# Patient Record
Sex: Female | Born: 1965 | Race: Black or African American | Hispanic: No | Marital: Single | Smoking: Current some day smoker
Health system: Southern US, Community
[De-identification: ages and names within clinical notes are randomized; demographics above are authoritative.]

## PROBLEM LIST (undated history)

## (undated) ENCOUNTER — Ambulatory Visit (HOSPITAL_COMMUNITY): Discharge status: Absent without leave | Payer: BLUE CROSS/BLUE SHIELD

## (undated) HISTORY — PX: ECTOPIC PREGNANCY SURGERY: SHX613

---

## 2017-03-12 ENCOUNTER — Encounter (HOSPITAL_COMMUNITY): Payer: Self-pay | Admitting: Emergency Medicine

## 2017-03-12 ENCOUNTER — Other Ambulatory Visit: Payer: Self-pay

## 2017-03-12 ENCOUNTER — Ambulatory Visit (HOSPITAL_COMMUNITY)
Admission: EM | Admit: 2017-03-12 | Discharge: 2017-03-12 | Disposition: A | Discharge status: Home / Self Care | Payer: Self-pay | Attending: Family Medicine | Admitting: Family Medicine

## 2017-03-12 DIAGNOSIS — K047 Periapical abscess without sinus: Secondary | ICD-10-CM

## 2017-03-12 DIAGNOSIS — K0889 Other specified disorders of teeth and supporting structures: Secondary | ICD-10-CM

## 2017-03-12 MED ORDER — AMOXICILLIN-POT CLAVULANATE 875-125 MG PO TABS: 1.0000 | ORAL_TABLET | Freq: Two times a day (BID) | ORAL | 0 refills | Status: DC

## 2017-03-12 MED ORDER — IBUPROFEN 800 MG PO TABS: 800.0000 mg | ORAL_TABLET | Freq: Three times a day (TID) | ORAL | 0 refills | Status: DC

## 2017-03-12 MED ORDER — TRAMADOL HCL 50 MG PO TABS: 50.0000 mg | ORAL_TABLET | Freq: Four times a day (QID) | ORAL | 0 refills | Status: DC | PRN

## 2017-03-12 NOTE — ED Triage Notes (Signed)
Pt c/o bilateral tooth pain, lower L side and upper R side. Facial pain. No fevers.

## 2017-03-12 NOTE — Discharge Instructions (Signed)
Please take medications as prescribed.  Return to the urgent care facility for any fevers, increasing pain, worsening symptoms or urgent changes in your health.

## 2017-03-12 NOTE — ED Provider Notes (Signed)
[facility]    CSN: [id number] Arrival date & time: 03/12/17  1552     History   Chief Complaint Chief Complaint  Patient presents with  . Dental Pain    HPI Kristine Ball is a 52 y.o. female presents to the urgent care facility for evaluation of 4 days of dental pain.  She has had right upper dental pain along the right upper second molar in the left lower lateral incisor.  She has a history of numerous dental caries and dental infections.  She has not been to the dentist due to letting her insurance lapse.  Pain is 10 out of 10.  She has been taking over-the-counter medications with no improvement.  She has no trouble swallowing.  No fevers. HPI  History reviewed. No pertinent past medical history.  There are no active problems to display for this patient.   Past Surgical History:  Procedure Laterality Date  . ECTOPIC PREGNANCY SURGERY      OB History    No data available       Home Medications    Prior to Admission medications   Medication Sig Start Date End Date Taking? Authorizing Provider  amoxicillin-clavulanate (AUGMENTIN) 875-125 MG tablet Take 1 tablet by mouth every 12 (twelve) hours. 7 days 03/12/17   Evon SlackGaines, Thomas C, PA-C  ibuprofen (ADVIL,MOTRIN) 800 MG tablet Take 1 tablet (800 mg total) by mouth 3 (three) times daily. 03/12/17   Evon SlackGaines, Thomas C, PA-C  traMADol (ULTRAM) 50 MG tablet Take 1 tablet (50 mg total) by mouth every 6 (six) hours as needed. 03/12/17   Evon SlackGaines, Thomas C, PA-C    Family History No family history on file.  Social History Social History   Tobacco Use  . Smoking status: Current Some Day Smoker  Substance Use Topics  . Alcohol use: No    Frequency: Never  . Drug use: Not on file     Allergies   Patient has no known allergies.   Review of Systems Review of Systems  Constitutional: Negative for fever.  HENT: Positive for dental problem. Negative for trouble swallowing.   Respiratory: Negative for shortness  of breath.   Cardiovascular: Negative for chest pain.  Gastrointestinal: Negative for abdominal pain.  Genitourinary: Negative for difficulty urinating, dysuria and urgency.  Musculoskeletal: Negative for back pain and myalgias.  Skin: Negative for rash.  Neurological: Negative for dizziness and headaches.     Physical Exam Triage Vital Signs ED Triage Vitals  Enc Vitals Group     BP 03/12/17 1635 129/73     Pulse Rate 03/12/17 1635 74     Resp 03/12/17 1635 16     Temp 03/12/17 1635 98.3 F (36.8 C)     Temp src --      SpO2 03/12/17 1635 100 %     Weight --      Height --      Head Circumference --      Peak Flow --      Pain Score 03/12/17 1636 10     Pain Loc --      Pain Edu? --      Excl. in GC? --    No data found.  Updated Vital Signs BP 129/73   Pulse 74   Temp 98.3 F (36.8 C)   Resp 16   SpO2 100%   Visual Acuity Right Eye Distance:   Left Eye Distance:   Bilateral Distance:    Right Eye Near:  Left Eye Near:    Bilateral Near:     Physical Exam  Constitutional: She is oriented to person, place, and time. She appears well-developed and well-nourished. No distress.  HENT:  Head: Normocephalic and atraumatic.  Right Ear: External ear normal.  Left Ear: External ear normal.  Nose: Nose normal.  Mouth/Throat: Uvula is midline and oropharynx is clear and moist. No oral lesions. No trismus in the jaw. Normal dentition. Dental abscesses and dental caries present. No uvula swelling.    Eyes: Conjunctivae and EOM are normal.  Neck: Normal range of motion. Neck supple.  Cardiovascular: Normal rate. Exam reveals no gallop and no friction rub.  No murmur heard. Pulmonary/Chest: Effort normal and breath sounds normal. No respiratory distress.  Musculoskeletal: Normal range of motion.  Neurological: She is alert and oriented to person, place, and time.  Skin: Skin is warm and dry. No rash noted.  Psychiatric: She has a normal mood and affect. Her  behavior is normal. Thought content normal.     UC Treatments / Results  Labs (all labs ordered are listed, but only abnormal results are displayed) Labs Reviewed - No data to display  EKG  EKG Interpretation None       Radiology No results found.  Procedures Procedures (including critical care time)  Medications Ordered in UC Medications - No data to display   Initial Impression / Assessment and Plan / UC Course  I have reviewed the triage vital signs and the nursing notes.  Pertinent labs & imaging results that were available during my care of the patient were reviewed by me and considered in my medical decision making (see chart for details).     52 year old female with dental pain, dental infection.  She is given prescription for oral antibiotic, tramadol, ibuprofen.  She is given information on follow-up with dental clinic.  She is educated on signs and symptoms return to clinic for.  Final Clinical Impressions(s) / UC Diagnoses   Final diagnoses:  Pain, dental  Dental infection    ED Discharge Orders        Ordered    traMADol (ULTRAM) 50 MG tablet  Every 6 hours PRN     03/12/17 1757    ibuprofen (ADVIL,MOTRIN) 800 MG tablet  3 times daily     03/12/17 1757    amoxicillin-clavulanate (AUGMENTIN) 875-125 MG tablet  Every 12 hours     03/12/17 1757        Evon Slack, PA-C 03/12/17 1802

## 2017-08-09 ENCOUNTER — Encounter (HOSPITAL_COMMUNITY): Payer: Self-pay | Admitting: Emergency Medicine

## 2017-08-09 ENCOUNTER — Ambulatory Visit (INDEPENDENT_AMBULATORY_CARE_PROVIDER_SITE_OTHER): Payer: BLUE CROSS/BLUE SHIELD

## 2017-08-09 ENCOUNTER — Ambulatory Visit (HOSPITAL_COMMUNITY)
Admission: EM | Admit: 2017-08-09 | Discharge: 2017-08-09 | Disposition: A | Discharge status: Home / Self Care | Payer: BLUE CROSS/BLUE SHIELD | Attending: Family Medicine | Admitting: Family Medicine

## 2017-08-09 ENCOUNTER — Other Ambulatory Visit: Payer: Self-pay

## 2017-08-09 DIAGNOSIS — M778 Other enthesopathies, not elsewhere classified: Secondary | ICD-10-CM

## 2017-08-09 DIAGNOSIS — M7581 Other shoulder lesions, right shoulder: Secondary | ICD-10-CM

## 2017-08-09 DIAGNOSIS — M25511 Pain in right shoulder: Secondary | ICD-10-CM

## 2017-08-09 DIAGNOSIS — M65811 Other synovitis and tenosynovitis, right shoulder: Secondary | ICD-10-CM | POA: Diagnosis not present

## 2017-08-09 MED ORDER — KETOROLAC TROMETHAMINE 60 MG/2ML IM SOLN
INTRAMUSCULAR | Status: AC
  Filled 2017-08-09: qty 2

## 2017-08-09 MED ORDER — KETOROLAC TROMETHAMINE 60 MG/2ML IM SOLN
60.0000 mg | Freq: Once | INTRAMUSCULAR | Status: AC
  Administered 2017-08-09: 60 mg via INTRAMUSCULAR

## 2017-08-09 MED ORDER — NAPROXEN 500 MG PO TABS: 500.0000 mg | ORAL_TABLET | Freq: Two times a day (BID) | ORAL | 0 refills | Status: DC

## 2017-08-09 NOTE — ED Triage Notes (Signed)
Right shoulder pain started today.  Denies any injury.  Last night right shoulder was sore.  Pain has increased since onset.  Movement makes pain significantly worse.

## 2017-08-09 NOTE — Discharge Instructions (Signed)
Ice of shoulder at end of day. Light and regular activity still with shoulder. See exercises to promote ROM of shoulder and prevent frozen shoulder. Naproxen twice a day, take with food. First dose tonight. May follow up with Sports Medicine or Orthopedics as needed if symptoms worsen or persist.

## 2017-08-09 NOTE — ED Provider Notes (Signed)
[facility]    CSN: [id number] Arrival date & time: 08/09/17  1228     History   Chief Complaint Chief Complaint  Patient presents with  . Shoulder Pain    HPI Karthika Glasper is a 52 y.o. female.   Keelynn presents with right shoulder pain which is worse with any movement. Mild pain last night and this morning which has progressively worsened. No specific injury. She was off of work yesterday so no increase in activity. She is a Production assistant, radio, she is right handed. Denies any previous similar. Took ibuprofen last at 0900 which mildly helped. No numbness or tingling. Pain radiates down to elbow. Without contributing medical history.     ROS per HPI.      History reviewed. No pertinent past medical history.  There are no active problems to display for this patient.   Past Surgical History:  Procedure Laterality Date  . ECTOPIC PREGNANCY SURGERY      OB History   None      Home Medications    Prior to Admission medications   Medication Sig Start Date End Date Taking? Authorizing Provider  naproxen (NAPROSYN) 500 MG tablet Take 1 tablet (500 mg total) by mouth 2 (two) times daily. 08/09/17   Georgetta Haber, NP    Family History Family History  Problem Relation Age of Onset  . Hypertension Mother     Social History Social History   Tobacco Use  . Smoking status: Current Some Day Smoker  Substance Use Topics  . Alcohol use: No    Frequency: Never  . Drug use: Never     Allergies   Patient has no known allergies.   Review of Systems Review of Systems   Physical Exam Triage Vital Signs ED Triage Vitals  Enc Vitals Group     BP 08/09/17 1242 134/80     Pulse Rate 08/09/17 1242 70     Resp 08/09/17 1242 18     Temp 08/09/17 1242 98.5 F (36.9 C)     Temp Source 08/09/17 1242 Oral     SpO2 08/09/17 1242 100 %     Weight --      Height --      Head Circumference --      Peak Flow --      Pain Score 08/09/17 1239 10     Pain Loc --        Pain Edu? --      Excl. in GC? --    No data found.  Updated Vital Signs BP 134/80 (BP Location: Left Arm)   Pulse 70   Temp 98.5 F (36.9 C) (Oral)   Resp 18   SpO2 100%    Physical Exam  Constitutional: She is oriented to person, place, and time. She appears well-developed and well-nourished. No distress.  Cardiovascular: Normal rate, regular rhythm and normal heart sounds.  Pulmonary/Chest: Effort normal and breath sounds normal.  Musculoskeletal:       Right shoulder: She exhibits decreased range of motion, tenderness, pain and decreased strength. She exhibits no bony tenderness, no swelling, no effusion, no crepitus, no deformity, no laceration, no spasm and normal pulse.       Right elbow: Normal.      Right wrist: Normal.       Right hand: Normal.  Pain with raising of the right arm, unable to go above shoulder level due to pain at anterior and posterior shoulder musculature; tend on palpation; pain  with external rotation and weakness with external rotation compared to left arm; strength similar in other directions; no numbness or tingling, strong radial pulse; pain radiates down bicep without specific bicep tenderness; no elbow tenderness; no clavicle or ac joint tenderness  Neurological: She is alert and oriented to person, place, and time.  Skin: Skin is warm and dry.     UC Treatments / Results  Labs (all labs ordered are listed, but only abnormal results are displayed) Labs Reviewed - No data to display  EKG None  Radiology Dg Shoulder Right  Result Date: 08/09/2017 CLINICAL DATA:  Right shoulder pain with increased stiffness today. Decreasing range of motion. Posterior scapular pain. EXAM: RIGHT SHOULDER - 2+ VIEW COMPARISON:  None. FINDINGS: There is no evidence of fracture or dislocation. There is no evidence of arthropathy or other focal bone abnormality. Soft tissues are unremarkable. IMPRESSION: Negative right shoulder radiographs. Electronically Signed    By: Marin Robertshristopher  Mattern M.D.   On: 08/09/2017 13:44    Procedures Procedures (including critical care time)  Medications Ordered in UC Medications  ketorolac (TORADOL) injection 60 mg (60 mg Intramuscular Given 08/09/17 1302)    Initial Impression / Assessment and Plan / UC Course  I have reviewed the triage vital signs and the nursing notes.  Pertinent labs & imaging results that were available during my care of the patient were reviewed by me and considered in my medical decision making (see chart for details).     Sudden onset of pain without specific injury. Pain radiates down upper arm. Concern for rotator cuff tendonitis. Nsaids. ROM exercises provided. Follow up with sports medicine and/or orthopedics. Patient verbalized understanding and agreeable to plan.    Final Clinical Impressions(s) / UC Diagnoses   Final diagnoses:  Acute pain of right shoulder  Shoulder tendonitis, right     Discharge Instructions     Ice of shoulder at end of day. Light and regular activity still with shoulder. See exercises to promote ROM of shoulder and prevent frozen shoulder. Naproxen twice a day, take with food. First dose tonight. May follow up with Sports Medicine or Orthopedics as needed if symptoms worsen or persist.     ED Prescriptions    Medication Sig Dispense Auth. Provider   naproxen (NAPROSYN) 500 MG tablet Take 1 tablet (500 mg total) by mouth 2 (two) times daily. 30 tablet Georgetta HaberBurky, Kyrell Ruacho B, NP     Controlled Substance Prescriptions [place] Controlled Substance Registry consulted? Not Applicable   Georgetta HaberBurky, Sasha Rueth B, NP 08/09/17 1357

## 2017-08-15 ENCOUNTER — Ambulatory Visit (INDEPENDENT_AMBULATORY_CARE_PROVIDER_SITE_OTHER): Payer: BLUE CROSS/BLUE SHIELD | Admitting: Family Medicine

## 2017-08-15 DIAGNOSIS — M542 Cervicalgia: Secondary | ICD-10-CM

## 2017-08-15 MED ORDER — PREDNISONE 10 MG PO TABS: ORAL_TABLET | ORAL | 0 refills | Status: DC

## 2017-08-15 NOTE — Patient Instructions (Signed)
You have cervical radiculopathy (a pinched nerve). Prednisone 6 day dose pack to relieve irritation/inflammation of the nerve. Don't take the naproxen while on the prednisone. Ok to take tylenol if needed with this though. Consider cervical collar if severely painful. Simple range of motion exercises within limits of pain to prevent further stiffness. Consider physical therapy for stretching, exercises, traction, and modalities in the future. Heat 15 minutes at a time 3-4 times a day to help with spasms. Watch head position when on computers, texting, when sleeping in bed - should in line with back to prevent further nerve traction and irritation. If not improving we will consider an MRI. Follow up with me in 1 week.

## 2017-08-16 ENCOUNTER — Encounter: Payer: Self-pay | Admitting: Family Medicine

## 2017-08-16 DIAGNOSIS — M25511 Pain in right shoulder: Secondary | ICD-10-CM | POA: Insufficient documentation

## 2017-08-16 NOTE — Assessment & Plan Note (Signed)
concerning for cervical radiculopathy.  Start with prednisone dose pack, transition to naproxen.  Tylenol if needed.  Heat for spasms.  Discussed ergonomic issues.  F/u in 1 week.

## 2017-08-16 NOTE — Progress Notes (Signed)
PCP: Patient, No Pcp Per  Subjective:   HPI: Patient is a 52 y.o. female here for right neck/shoulder pain.  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 She reports on Thursday she noticed posterior right shoulder at work, arm pain that became more severe about 4 hours into shift. Worse with all movements of right arm. Radiates down to arm including hand cramping, some numbness. Pain level 7/10 and sharp but was 12/10 level when she went to the ED. Improving with naproxen but only mildly. No bowel/bladder dysfunction.  History reviewed. No pertinent past medical history.  Current Outpatient Medications on File Prior to Visit  Medication Sig Dispense Refill  . naproxen (NAPROSYN) 500 MG tablet Take 1 tablet (500 mg total) by mouth 2 (two) times daily. 30 tablet 0   No current facility-administered medications on file prior to visit.     Past Surgical History:  Procedure Laterality Date  . ECTOPIC PREGNANCY SURGERY      No Known Allergies  Social History   Socioeconomic History  . Marital status: Single    Spouse name: Not on file  . Number of children: Not on file  . Years of education: Not on file  . Highest education level: Not on file  Occupational History  . Not on file  Social Needs  . Financial resource strain: Not on file  . Food insecurity:    Worry: Not on file    Inability: Not on file  . Transportation needs:    Medical: Not on file    Non-medical: Not on file  Tobacco Use  . Smoking status: Current Some Day Smoker  Substance and Sexual Activity  . Alcohol use: No    Frequency: Never  . Drug use: Never  . Sexual activity: Not on file  Lifestyle  . Physical activity:    Days per week: Not on file    Minutes per session: Not on file  . Stress: Not on file  Relationships  . Social connections:    Talks on phone: Not on file    Gets together: Not on file    Attends religious service: Not on file    Active member of club or  organization: Not on file    Attends meetings of clubs or organizations: Not on file    Relationship status: Not on file  . Intimate partner violence:    Fear of current or ex partner: Not on file    Emotionally abused: Not on file    Physically abused: Not on file    Forced sexual activity: Not on file  Other Topics Concern  . Not on file  Social History Narrative  . Not on file    Family History  Problem Relation Age of Onset  . Hypertension Mother     BP 119/80   Ht 5\' 7"  (1.702 m)   Wt 176 lb (79.8 kg)   BMI 27.57 kg/m   Review of Systems: See HPI above.     Objective:  Physical Exam:  Gen: NAD, comfortable in exam room  Neck: No gross deformity, swelling, bruising. TTP right trapezius, cervical paraspinal region.  No midline/bony TTP. FROM. BUE strength 5/5. Sensation diminished right index and pinky digits.   1+ right triceps, 2+ left triceps and bilateral equal reflexes in biceps, brachioradialis tendons. Cap refill < 2 sec digits;  2+ radial pulses.  Right shoulder: No swelling, ecchymoses.  No gross deformity. No TTP except that noted above. FROM. Negative  Ananias Pilgrim. Negative Yergasons. Strength 5/5 with empty can and resisted internal/external rotation. Negative apprehension. NV intact distally.   Assessment & Plan:  1. Neck pain radiating into right arm - concerning for cervical radiculopathy.  Start with prednisone dose pack, transition to naproxen.  Tylenol if needed.  Heat for spasms.  Discussed ergonomic issues.  F/u in 1 week.

## 2017-08-22 ENCOUNTER — Ambulatory Visit
Admission: RE | Admit: 2017-08-22 | Discharge: 2017-08-22 | Disposition: A | Discharge status: Home / Self Care | Payer: BLUE CROSS/BLUE SHIELD | Source: Ambulatory Visit | Attending: Family Medicine | Admitting: Family Medicine

## 2017-08-22 ENCOUNTER — Inpatient Hospital Stay: Payer: BLUE CROSS/BLUE SHIELD

## 2017-08-22 ENCOUNTER — Ambulatory Visit (INDEPENDENT_AMBULATORY_CARE_PROVIDER_SITE_OTHER): Payer: BLUE CROSS/BLUE SHIELD | Admitting: Family Medicine

## 2017-08-22 ENCOUNTER — Encounter: Payer: Self-pay | Admitting: Family Medicine

## 2017-08-22 VITALS — BP 129/85 | Ht 67.0 in | Wt 176.0 lb

## 2017-08-22 DIAGNOSIS — M25511 Pain in right shoulder: Secondary | ICD-10-CM | POA: Diagnosis not present

## 2017-08-22 DIAGNOSIS — M542 Cervicalgia: Secondary | ICD-10-CM

## 2017-08-22 MED ORDER — MELOXICAM 15 MG PO TABS: 15.0000 mg | ORAL_TABLET | Freq: Every day | ORAL | 0 refills | Status: DC

## 2017-08-22 MED ORDER — METHYLPREDNISOLONE ACETATE 40 MG/ML IJ SUSP
40.0000 mg | Freq: Once | INTRAMUSCULAR | Status: AC
  Administered 2017-08-22: 40 mg via INTRA_ARTICULAR

## 2017-08-22 NOTE — Progress Notes (Signed)
Kristine BellowsStefanie Ball is a 52 y.o. female who is here for follow up of neck and shoulder pain.     HPI: Kristine HedgesStefanie is a 52 year old healthy female who returns to sports medicine for follow-up of right shoulder and neck pain.  Since her last visit, she reports no improvement of the pain.  Completed her prednisone as instructed without any relief.  She is currently in 9/10 pain diffusely spread in her right shoulder with severe limitations in all directions of movement. Additionally, she describes new pain spreading from the anterior shoulder partially across her upper chest. According to patient, pain increases with any movement.  If she tries to extend her right arm reports pain radiates up the right side of her neck.  Does not feel that the pain starts in her neck and goes down her arm.  She is right-handed and is having difficulty completing daily tasks such as brushing her hair or opening containers.  Believe she has both pain and weakness of the right shoulder and upper arm.  Denies any numbness or tingling in her arm or hand.  No overlying skin changes or masses.   Has been wearing the sling when she is out of the house, but even when she does not the sling on she holds her arm partially flexed at elbow for comfort.  Has not tried any OTC pain medications as she thought that she could not combine them with prednisone.  No other treatment tried.  No other joint pain.  No other changes in health since last visit.  Denies any chronic medical conditions or daily medications.  Patient was last seen in sports medicine on 08/15/2017 for neck pain and posterior right shoulder pain.  No, there was no preceding injury prior to the onset of this pain.  Denies any history of shoulder or neck injury.  Prior to onset of this pain, patient reports she was at work trying to scoop a large amount of sugar. No known injury at that time.  Review of systems:  As stated above   Interval past medical history, surgical history,  family history, and social history obtained and reviewed.   Patient Active Problem List   Diagnosis Date Noted  . Neck pain on right side 08/16/2017    Physical Exam:  BP 129/85   Ht 5\' 7"  (1.702 m)   Wt 176 lb (79.8 kg)   BMI 27.57 kg/m    Physical Exam  Physical exam: Vital signs are reviewed and are documented in the chart Gen.: Alert, oriented, appears stated age, cries with shoulder exam HEENT: Moist oral mucosa Respiratory: Normal respirations, able to speak in full sentences Cardiac: Regular rate, distal pulses 2+ Integumentary: No rashes on visible skin Neurologic: Strength 4/5 for rotator cuff and elbow flexion/extension but limited testing due to pain. Normal wrist, grip, and hand strength. Sensation intact in bilateral upper extremities. Psych: Normal affect, mood is described as good Musculoskeletal:  C-Spine: No palpable deformity or bony tenderness. Equivocal Spurlings. Shoulder/upper back: Even when sling is removed, she preferentially holds arm flexed at elbow and in front of body (like it is still in the sling). Inspection of right shoulder, neck and anterior upper chest reveals no obvious deformity or muscle atrophy. No warmth, erythema, ecchymosis, or effusion. She is diffusely tender to palpation across right upper chest, on anterior and posterior right shoulder (including over scapular spin, distal clavicle, AC joint), and extending onto levator scapulae and right paracervical muscles. Likely muscle spasms of upper  back and right paracervical muscles. She is tender over biceps tendon, but not distinct or greater tenderness vs. Other areas. Limited shoulder ROM due to pain in all directions: External rotation 70, Internal rotation normal, 70 deg abduction, 30 int ROM, 70 deg ext ROM. Positive drop arm test. With arm extended, pt reports pain radiates into the right side of her neck. Special tests: All positive for pain on right side. Haw[place], Neers, O'Brian, Cross  arm, Spur, [place], empty can. Left shoulder exam normal.   Shoulder injection:  After written informed consent signed and obtained, and benefit of pain relief and risk of bleeding, infection, and steroid flare discussed, Kristine Ball agreed to proceed with cortisone injection into the right subacromial bursa. After timeout, area  was cleaned with Betadine and alcohol wipes. Ethyl chloride was used as topical anesthetic spray. Using 4 cc 1% lidocaine without epinephrine and 2 cc of 40 mg Depo-Medrol on a 22-gauge 1.5 inch needle, her right subacromial bursa was injected. She did not have any bleeding afterwards. No complications noted from procedure.  Assessment/Plan: Kristine Ball is a 52yr old healthy female who returns to sports medicine for persistent right shoulder pain, with radiation into her neck. She has severely limited ROM, diffuse tenderness, weakness (though suspect secondary to pain), and all positive special tests. However, given her limited ROM and current severe level of pain, exam is difficult to localize etiology of pain. Etiologies considered include a rotator cuff abnormality, tendinopathy, or referred cervical radiculopathy. Her pain and exam is likely worse due to her limited movement and sling use, and she is at risk for developing adhesive capsulitis. She was given a subacromial steroid injection today for both therapeutic and diagnostic use; she did have small improvement in ROM and pain after immediately after injection which makes shoulder pathology more likely. Since cervical radiculopathy cannot be excluded, will get a c-spine xray today. She had a shoulder xray in urgent care on 08/09/17 which was unremarkable. If symptoms do not improve with below treatment, could consider an MRI.  1) Right shoulder pain and stiffness -Recommend gentle ROM exercises today/tomorrow as steroids take effect. Limit use of sling. -Referred for physical therapy -C-Spine xray to eval for DDD, bony  abormality or other irregularities -Rx Mobic 15mg  daily, try heat/massage/ice to tight upper back/neck muscles   Follow up in 2 weeks for reevaluation   Annell Greening, MD, MS [facility] Primary Care Pediatrics PGY3  I independently saw and evaluated patient alongside with Annell Greening MD, PGY3 and agree with her assessment and plan. In brief summary, Kristine Ball was seen here last week by Dr. Pearletha Forge for initial presentation and evaluation of right shoulder pain. It was felt that symptoms were referred from right cervical radiculopathy. She was prescribed prednisone dose-pak. Unfortunately, she has not had any interval improvement today. She is using a sling and is much more guarded today in her right shoulder and has decreased ROM in right shoulder secondary to pain. Given pain and limitations in ROM, offered her cortisone injection into right subacromial bursa to help with pain relief. She is agreeable to this, injection done as noted above without any complications noted. She will be getting XR of her C-spine for suspect right cervical radiculopathy. Will send in for meloxicam 15 mg daily, discussed not to take any other ibuprofen, motrin, or Aleve while on this. Tylenol is ok to use. She may need MRI evaluation if she continues to have pain. Given concern for stiffness and limitations in ROM, discussed referral  to formal PT. She is agreeable to this. Doubt she is developing adhesive capsulitis, suspect more guarding than anything else. She will follow up with Dr. Pearletha Forge in 2 weeks for re-evaluation or sooner as needed.   Haynes Kerns, MD Primary Care Sports Medicine Fellow [facility]

## 2017-08-22 NOTE — Progress Notes (Deleted)
Patient ID: Jonathon BellowsStefanie Wetherby, female   DOB: 08/11/1965, 52 y.o.   MRN: [id number] Seen in ED 08/09/2017: right shoulder pain which is worse with any movement. Mild pain last night and this morning which has progressively worsened. No specific injury. She was off of work yesterday so no increase in activity. She is a Production assistant, radioserver, she is right handed. Denies any previous similar. Took ibuprofen last at 0900 which mildly helped. No numbness or tingling. Pain radiates down to elbow. Without contributing medical history.    From A/P: Sudden onset of pain without specific injury. Pain radiates down upper arm. Concern for rotator cuff tendonitis. Nsaids. ROM exercises provided. Follow up with sports medicine and/or orthopedics. Patient verbalized understanding and agreeable to plan.   Saw sports medicine 08/15/2017: From A/P: 1. Neck pain radiating into right arm - concerning for cervical radiculopathy.  Start with prednisone dose pack, transition to naproxen.  Tylenol if needed.  Heat for spasms.  Discussed ergonomic issues.  F/u in 1 week.

## 2017-09-05 ENCOUNTER — Ambulatory Visit: Payer: Self-pay

## 2017-09-05 ENCOUNTER — Ambulatory Visit (INDEPENDENT_AMBULATORY_CARE_PROVIDER_SITE_OTHER): Payer: BLUE CROSS/BLUE SHIELD | Admitting: Family Medicine

## 2017-09-05 ENCOUNTER — Encounter: Payer: Self-pay | Admitting: Family Medicine

## 2017-09-05 VITALS — BP 122/82 | Ht 67.0 in | Wt 176.0 lb

## 2017-09-05 DIAGNOSIS — M25511 Pain in right shoulder: Secondary | ICD-10-CM | POA: Diagnosis not present

## 2017-09-05 MED ORDER — CYCLOBENZAPRINE HCL 10 MG PO TABS: 10.0000 mg | ORAL_TABLET | Freq: Three times a day (TID) | ORAL | 1 refills | Status: DC | PRN

## 2017-09-05 NOTE — Progress Notes (Signed)
PCP: Patient, No Pcp Per  Subjective:   HPI: Patient is a 52 y.o. female here for followup for right shoulder and neck pain. She has completed about 5 visits of physical therapy. At the prior visit she was given a subacromial corticosteroid injection and started on meloxicam. She feels that PT has helped slightly with some ROM but she continues to have significant limitations; she cannot fully extend her elbow, she is unable to flex or abduct at the shoulder without assistance from her left hand. She is also limited with external rotation. Regarding the steroid injection, she reports only about a day and 1/2 of relief from this. She feels the mobic has not been helpful. She continues to have pain throughout her entire shoulder and right side of her neck. She maintains a position of internal rotation and flexion at the elbow for comfort. She occasionally wears a sling but acknowledges this has negatively effected her ROM. She denies any numbness or tingling in the distal extremity.  No past medical history on file.  Current Outpatient Medications on File Prior to Visit  Medication Sig Dispense Refill  . meloxicam (MOBIC) 15 MG tablet Take 1 tablet (15 mg total) by mouth daily. 30 tablet 0  . naproxen (NAPROSYN) 500 MG tablet Take 1 tablet (500 mg total) by mouth 2 (two) times daily. (Patient not taking: Reported on 09/05/2017) 30 tablet 0  . predniSONE (DELTASONE) 10 MG tablet 6 tabs po day 1, 5 tabs po day 2, 4 tabs po day 3, 3 tabs po day 4, 2 tabs po day 5, 1 tab po day 6 (Patient not taking: Reported on 09/05/2017) 21 tablet 0   No current facility-administered medications on file prior to visit.     Past Surgical History:  Procedure Laterality Date  . ECTOPIC PREGNANCY SURGERY      No Known Allergies  Social History   Socioeconomic History  . Marital status: Single    Spouse name: Not on file  . Number of children: Not on file  . Years of education: Not on file  . Highest education  level: Not on file  Occupational History  . Not on file  Social Needs  . Financial resource strain: Not on file  . Food insecurity:    Worry: Not on file    Inability: Not on file  . Transportation needs:    Medical: Not on file    Non-medical: Not on file  Tobacco Use  . Smoking status: Current Some Day Smoker  Substance and Sexual Activity  . Alcohol use: No    Frequency: Never  . Drug use: Never  . Sexual activity: Not on file  Lifestyle  . Physical activity:    Days per week: Not on file    Minutes per session: Not on file  . Stress: Not on file  Relationships  . Social connections:    Talks on phone: Not on file    Gets together: Not on file    Attends religious service: Not on file    Active member of club or organization: Not on file    Attends meetings of clubs or organizations: Not on file    Relationship status: Not on file  . Intimate partner violence:    Fear of current or ex partner: Not on file    Emotionally abused: Not on file    Physically abused: Not on file    Forced sexual activity: Not on file  Other Topics Concern  .  Not on file  Social History Narrative  . Not on file    Family History  Problem Relation Age of Onset  . Hypertension Mother     BP 122/82   Ht 5\' 7"  (1.702 m)   Wt 176 lb (79.8 kg)   BMI 27.57 kg/m   Review of Systems: See HPI above.     Objective:  Physical Exam:  Gen: NAD, comfortable in exam room  Neck: No gross deformity, swelling, bruising. TTP right trapezius, paraspinal cervical region.  No midline/bony TTP. FROM. Sensation intact to light touch.   NV intact distal BUEs.  Shoulder: Right shoulder held slightly elevated and internally rotated and holding elbow in flextion. No bruising. No swelling TTP globally, no focal tenderness.  Pt unable to abduct or forward flex at the shoulder. ER limited to about 10 degrees. IR normal. NV intact distally Special Tests: pan positive  Right elbow: No  deformity. Lacks 10 degrees extension.  Full flexion.  5/5 strength flexion/extension. No tenderness to palpation. NVI distally.  [facility] Ultrasound - Shoulder, Right: Biceps Tendon: intact without evidence of tenosynovitis on long and trans views Subscapularis: intact without tears AC joint: mild arthritic changes seen. No geyser sign Infraspinatus/Teres: intact without tears Supraspinatus: suboptimal visualization due to pt not tolerating positioning. However, no tears were identified   Assessment & Plan:  1. Right shoulder pain - nonspecific right shoulder pain which is significantly limiting her function. C-spine xrays reviewed which show only mild degenerative changes and do not correlate with her symptoms. Doubt RTC pathology as I would have expected better improvement following her subacromial injection and her US performed today in clinic showed no obvious pathology. Despite her pain, her exam is not c/w adhesive capsulitis. Muscle spasm vs brachial neuritis is most likely current working diagnosis as shoulder joint pathology seems less likely - continue physical therapy to work on ROM - continue NSAIDs - Flexeril 10mg  q8hr prn - it is likely that a significant amount of her symptoms are due to muscle tightness/spasm. Will try course of flexeril  2. Biceps tendon contracture - 2/2 prolong immobilization. - pt was encouraged to work on home exercises to improve her ROM at the elbow. - discouraged further restriction with wearing sling

## 2017-09-05 NOTE — Assessment & Plan Note (Signed)
nonspecific right shoulder pain which is significantly limiting her function. C-spine xrays reviewed which show only mild degenerative changes and do not correlate with her symptoms. Doubt RTC pathology as I would have expected better improvement following her subacromial injection and her US performed today in clinic showed no obvious pathology. Despite her pain, her exam is not c/w adhesive capsulitis. Muscle spasm vs brachial neuritis is most likely current working diagnosis as shoulder joint pathology seems less likely - continue physical therapy to work on ROM - continue NSAIDs - Flexeril 10mg  q8hr prn - it is likely that a significant amount of her symptoms are due to muscle tightness/spasm. Will try course of flexeril

## 2017-09-05 NOTE — Patient Instructions (Signed)
Continue with your physical therapy. Continue the meloxicam - you could try aleve 2 tabs twice a day with food instead if this isn't helping you enough. Flexeril as needed for muscle spasms - this may make you sleepy. Topical capsaicin, topical salon pas patches may help. We discussed [place] is a condition that commonly presents this way with severe pain into the shoulder and developing weakness - therapy, the prednisone you took, and medications above are the treatment for this. We can consider imaging your neck (MRI), nerve-blocking medications as well if you're struggling. Follow up with me in 4 weeks.

## 2017-09-17 ENCOUNTER — Ambulatory Visit: Payer: BLUE CROSS/BLUE SHIELD | Admitting: Family Medicine

## 2017-09-19 ENCOUNTER — Ambulatory Visit (INDEPENDENT_AMBULATORY_CARE_PROVIDER_SITE_OTHER): Payer: BLUE CROSS/BLUE SHIELD | Admitting: Family Medicine

## 2017-09-19 ENCOUNTER — Encounter: Payer: Self-pay | Admitting: Family Medicine

## 2017-09-19 DIAGNOSIS — M25511 Pain in right shoulder: Secondary | ICD-10-CM

## 2017-09-19 NOTE — Patient Instructions (Signed)
We will go ahead with an MRI of your shoulder to assess for a rotator cuff tear since you've not improved with physical therapy, home exercises, meloxicam, naproxen, prednisone, flexeril, injection. I will call you with results and next steps.

## 2017-09-19 NOTE — Progress Notes (Addendum)
PCP: Patient, No Pcp Per  Subjective:   HPI: Patient is a 52 y.o. female here for right neck/shoulder pain.  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 She reports on Thursday she noticed posterior right shoulder at work, arm pain that became more severe about 4 hours into shift. Worse with all movements of right arm. Radiates down to arm including hand cramping, some numbness. Pain level 7/10 and sharp but was 12/10 level when she went to the ED. Improving with naproxen but only mildly. No bowel/bladder dysfunction.  7/17: Patient is a 52 y.o. female here for followup for right shoulder and neck pain. She has completed about 5 visits of physical therapy. At the prior visit she was given a subacromial corticosteroid injection and started on meloxicam. She feels that PT has helped slightly with some ROM but she continues to have significant limitations; she cannot fully extend her elbow, she is unable to flex or abduct at the shoulder without assistance from her left hand. She is also limited with external rotation. Regarding the steroid injection, she reports only about a day and 1/2 of relief from this. She feels the mobic has not been helpful. She continues to have pain throughout her entire shoulder and right side of her neck. She maintains a position of internal rotation and flexion at the elbow for comfort. She occasionally wears a sling but acknowledges this has negatively effected her ROM. She denies any numbness or tingling in the distal extremity.  7/31: Patient reports she continues to struggle with right shoulder pain. She states she still taking meloxicam and Flexeril. The pain is anterior and lateral, 8 out of 10 level and sharp. Is rating up into the right side of her neck. She is doing some of her home exercises she learned in physical therapy. No skin changes, numbness. No new injuries.  History reviewed. No pertinent past medical history.  Current  Outpatient Medications on File Prior to Visit  Medication Sig Dispense Refill  . cyclobenzaprine (FLEXERIL) 10 MG tablet Take 1 tablet (10 mg total) by mouth 3 (three) times daily as needed for muscle spasms. 60 tablet 1  . meloxicam (MOBIC) 15 MG tablet Take 1 tablet (15 mg total) by mouth daily. 30 tablet 0  . naproxen (NAPROSYN) 500 MG tablet Take 1 tablet (500 mg total) by mouth 2 (two) times daily. (Patient not taking: Reported on 09/05/2017) 30 tablet 0  . predniSONE (DELTASONE) 10 MG tablet 6 tabs po day 1, 5 tabs po day 2, 4 tabs po day 3, 3 tabs po day 4, 2 tabs po day 5, 1 tab po day 6 (Patient not taking: Reported on 09/05/2017) 21 tablet 0   No current facility-administered medications on file prior to visit.     Past Surgical History:  Procedure Laterality Date  . ECTOPIC PREGNANCY SURGERY      No Known Allergies  Social History   Socioeconomic History  . Marital status: Single    Spouse name: Not on file  . Number of children: Not on file  . Years of education: Not on file  . Highest education level: Not on file  Occupational History  . Not on file  Social Needs  . Financial resource strain: Not on file  . Food insecurity:    Worry: Not on file    Inability: Not on file  . Transportation needs:    Medical: Not on file    Non-medical: Not on file  Tobacco  Use  . Smoking status: Current Some Day Smoker  Substance and Sexual Activity  . Alcohol use: No    Frequency: Never  . Drug use: Never  . Sexual activity: Not on file  Lifestyle  . Physical activity:    Days per week: Not on file    Minutes per session: Not on file  . Stress: Not on file  Relationships  . Social connections:    Talks on phone: Not on file    Gets together: Not on file    Attends religious service: Not on file    Active member of club or organization: Not on file    Attends meetings of clubs or organizations: Not on file    Relationship status: Not on file  . Intimate partner  violence:    Fear of current or ex partner: Not on file    Emotionally abused: Not on file    Physically abused: Not on file    Forced sexual activity: Not on file  Other Topics Concern  . Not on file  Social History Narrative  . Not on file    Family History  Problem Relation Age of Onset  . Hypertension Mother     BP 128/86   Ht 5\' 7"  (1.702 m)   Wt 176 lb (79.8 kg)   BMI 27.57 kg/m   Review of Systems: See HPI above.     Objective:  Physical Exam:  Gen: NAD, comfortable in exam room  Right shoulder: No swelling, ecchymoses.  No gross deformity. TTP right trapezius, lateral shoulder. Full ER, abduction and flexion to 90 degrees, painful Positive Hawkins, Neers. Negative Yergasons. Strength 5/5 with empty can and resisted internal/external rotation.  Pain with empty can. Negative apprehension. NV intact distally.  Left shoulder: No swelling, ecchymoses.  No gross deformity. No TTP except that noted above. FROM. Strength 5/5 with empty can and resisted internal/external rotation. NV intact distally.   Assessment & Plan:  1. Right shoulder, neck pain - Patient continues to struggle with pain despite conservative treatment that includes prednisone, meloxicam, flexeril, naproxen, physical therapy, home exercises, subacromial injection.  Differential includes partial rotator cuff tear not seen on ultrasound due to positioning, brachial neuropathy, cervical radiculopathy.  Advised we go ahead with MRI to further assess for possible rotator cuff tear given today's exam.  Can continue meloxicam with flexeril as needed in meantime.

## 2017-09-21 ENCOUNTER — Other Ambulatory Visit: Payer: BLUE CROSS/BLUE SHIELD

## 2017-10-03 ENCOUNTER — Ambulatory Visit (INDEPENDENT_AMBULATORY_CARE_PROVIDER_SITE_OTHER): Payer: Self-pay | Admitting: Family Medicine

## 2017-10-03 ENCOUNTER — Encounter: Payer: Self-pay | Admitting: Family Medicine

## 2017-10-03 VITALS — BP 112/82 | Ht 67.0 in | Wt 178.0 lb

## 2017-10-03 DIAGNOSIS — M25511 Pain in right shoulder: Secondary | ICD-10-CM

## 2017-10-03 MED ORDER — NITROGLYCERIN 0.2 MG/HR TD PT24
MEDICATED_PATCH | TRANSDERMAL | 1 refills | Status: DC
Start: 2017-10-03 — End: 2017-11-14

## 2017-10-03 NOTE — Progress Notes (Signed)
PCP: Patient, No Pcp Per  Subjective:   HPI: Patient is a 52 y.o. female here for right neck/shoulder pain.  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 She reports on Thursday she noticed posterior right shoulder at work, arm pain that became more severe about 4 hours into shift. Worse with all movements of right arm. Radiates down to arm including hand cramping, some numbness. Pain level 7/10 and sharp but was 12/10 level when she went to the ED. Improving with naproxen but only mildly. No bowel/bladder dysfunction.  7/17: Patient is a 52 y.o. female here for followup for right shoulder and neck pain. She has completed about 5 visits of physical therapy. At the prior visit she was given a subacromial corticosteroid injection and started on meloxicam. She feels that PT has helped slightly with some ROM but she continues to have significant limitations; she cannot fully extend her elbow, she is unable to flex or abduct at the shoulder without assistance from her left hand. She is also limited with external rotation. Regarding the steroid injection, she reports only about a day and 1/2 of relief from this. She feels the mobic has not been helpful. She continues to have pain throughout her entire shoulder and right side of her neck. She maintains a position of internal rotation and flexion at the elbow for comfort. She occasionally wears a sling but acknowledges this has negatively effected her ROM. She denies any numbness or tingling in the distal extremity.  7/31: Patient reports she continues to struggle with right shoulder pain. She states she still taking meloxicam and Flexeril. The pain is anterior and lateral, 8 out of 10 level and sharp. Is rating up into the right side of her neck. She is doing some of her home exercises she learned in physical therapy. No skin changes, numbness. No new injuries.  8/14: Patient returns with continued pain right shoulder. She was  unable to get MRI of her shoulder due to cost. Feels like there isn't much strength in her shoulder. Has been doing home exercises learned from physical therapy. No numbness.  History reviewed. No pertinent past medical history.  Current Outpatient Medications on File Prior to Visit  Medication Sig Dispense Refill  . cyclobenzaprine (FLEXERIL) 10 MG tablet Take 1 tablet (10 mg total) by mouth 3 (three) times daily as needed for muscle spasms. 60 tablet 1  . meloxicam (MOBIC) 15 MG tablet Take 1 tablet (15 mg total) by mouth daily. 30 tablet 0  . naproxen (NAPROSYN) 500 MG tablet Take 1 tablet (500 mg total) by mouth 2 (two) times daily. (Patient not taking: Reported on 09/05/2017) 30 tablet 0  . predniSONE (DELTASONE) 10 MG tablet 6 tabs po day 1, 5 tabs po day 2, 4 tabs po day 3, 3 tabs po day 4, 2 tabs po day 5, 1 tab po day 6 (Patient not taking: Reported on 09/05/2017) 21 tablet 0   No current facility-administered medications on file prior to visit.     Past Surgical History:  Procedure Laterality Date  . ECTOPIC PREGNANCY SURGERY      No Known Allergies  Social History   Socioeconomic History  . Marital status: Single    Spouse name: Not on file  . Number of children: Not on file  . Years of education: Not on file  . Highest education level: Not on file  Occupational History  . Not on file  Social Needs  . Financial resource strain:  Not on file  . Food insecurity:    Worry: Not on file    Inability: Not on file  . Transportation needs:    Medical: Not on file    Non-medical: Not on file  Tobacco Use  . Smoking status: Current Some Day Smoker  Substance and Sexual Activity  . Alcohol use: No    Frequency: Never  . Drug use: Never  . Sexual activity: Not on file  Lifestyle  . Physical activity:    Days per week: Not on file    Minutes per session: Not on file  . Stress: Not on file  Relationships  . Social connections:    Talks on phone: Not on file    Gets  together: Not on file    Attends religious service: Not on file    Active member of club or organization: Not on file    Attends meetings of clubs or organizations: Not on file    Relationship status: Not on file  . Intimate partner violence:    Fear of current or ex partner: Not on file    Emotionally abused: Not on file    Physically abused: Not on file    Forced sexual activity: Not on file  Other Topics Concern  . Not on file  Social History Narrative  . Not on file    Family History  Problem Relation Age of Onset  . Hypertension Mother     BP 112/82   Ht 5\' 7"  (1.702 m)   Wt 178 lb (80.7 kg)   BMI 27.88 kg/m   Review of Systems: See HPI above.     Objective:  Physical Exam:  Gen: NAD, comfortable in exam room  Right shoulder: No swelling, ecchymoses.  No gross deformity. TTP trapezius, lateral shoulder. Full ER.  Abduction and flexion to 100 degrees with pain. Positive Hawkins, Neers. Negative Yergasons. Strength 5-/5 with empty can and resisted internal/external rotation.  Pain empty can > ER NV intact distally.   Assessment & Plan:  1. Right shoulder, neck pain -unfortunately patient was unable to get the MRI of her right shoulder due to cost.  She will trial Nitropatch is one fourth of the patch and change daily.  We discussed the risks of headache and skin irritation with these.  She will continue her home exercises, meloxicam with Flexeril.  We discussed contacting the imaging facility to see if she can set up a payment plan.  Plan to follow-up in 6 weeks.

## 2017-10-03 NOTE — Patient Instructions (Signed)
Continue with the meloxicam and flexeril as you have been. Continue home exercises. Start nitro patches - 1/4th patch, change daily. Follow up with me in 6 weeks. If you go ahead with the MRI (you may want to give the patches time to work before considering this).

## 2017-10-05 ENCOUNTER — Encounter: Payer: Self-pay | Admitting: *Deleted

## 2017-11-14 ENCOUNTER — Ambulatory Visit (INDEPENDENT_AMBULATORY_CARE_PROVIDER_SITE_OTHER): Payer: Self-pay | Admitting: Family Medicine

## 2017-11-14 ENCOUNTER — Encounter: Payer: Self-pay | Admitting: Family Medicine

## 2017-11-14 VITALS — BP 120/70 | Ht 67.0 in | Wt 178.0 lb

## 2017-11-14 DIAGNOSIS — M25511 Pain in right shoulder: Secondary | ICD-10-CM

## 2017-11-14 MED ORDER — NITROGLYCERIN 0.2 MG/HR TD PT24: MEDICATED_PATCH | TRANSDERMAL | 1 refills | Status: AC

## 2017-11-14 NOTE — Patient Instructions (Signed)
[facility] has the nitro patches for $20.65. To get that price, show the [facility] pharmacist the www.goodRx.com page for nitroglycerin patches and ask them to apply that coupon.   We have 3 new arm exercises to recommend: 1) Complete circular motions with your arm - 3 sets of 10 2) Arm swings - swing your arm side to side and back to front; complete 3 sets of 10 3) Table slides - slide your arm forward and back and sideways across a table surface; complete 3 sets of 10   We will provide some paperwork today about [facility] resources for uninsured patients

## 2017-11-14 NOTE — Progress Notes (Signed)
PCP: Patient, No Pcp Per  Subjective:   HPI: Patient is a 52 y.o. female here for right shoulder pain.  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 She reports on Thursday she noticed posterior right shoulder at work, arm pain that became more severe about 4 hours into shift. Worse with all movements of right arm. Radiates down to arm including hand cramping, some numbness. Pain level 7/10 and sharp but was 12/10 level when she went to the ED. Improving with naproxen but only mildly. No bowel/bladder dysfunction.  7/17: Patient is a52 y.o.femalehere for followup for right shoulder and neck pain. She has completed about 5 visits of physical therapy. At the prior visit she was given a subacromial corticosteroid injection and started on meloxicam. She feels that PT has helped slightly with some ROM but she continues to have significant limitations; she cannot fully extend her elbow, she is unable to flex or abduct at the shoulder without assistance from her left hand. She is also limited with external rotation. Regarding the steroid injection, she reports only about a day and 1/2 of relief from this. She feels the mobic has not been helpful. She continues to have pain throughout her entire shoulder and right side of her neck. She maintains a position of internal rotation and flexion at the elbow for comfort. She occasionally wears a sling but acknowledges this has negatively effected her ROM. She denies any numbness or tingling in the distal extremity.  7/31: Patient reports she continues to struggle with right shoulder pain. She states she still taking meloxicam and Flexeril. The pain is anterior and lateral, 8 out of 10 level and sharp. Is rating up into the right side of her neck. She is doing some of her home exercises she learned in physical therapy. No skin changes, numbness. No new injuries.  8/14: Patient returns with continued pain right shoulder. She was  unable to get MRI of her shoulder due to cost. Feels like there isn't much strength in her shoulder. Has been doing home exercises learned from physical therapy. No numbness.  9/25: Patient presents with continued right shoulder pain Today, she has 2 new symptoms: cannot raise her arm above shoulder level during abduction and irritation around collar bone She stills feels like the arm is weak and feels "heavy" Pain level 6/10 No: skin changes, numbness, new injuries  She has continued with PT exercises at home without issue She went to pick up the Nitropatch but didn't get it because it was too expensive ($37) and is hoping we have samples She is taking her meloxicam with flexeril and this is helping She did not follow up with the MRI facility about a payment plan  No past medical history on file.  Current Outpatient Medications on File Prior to Visit  Medication Sig Dispense Refill  . cyclobenzaprine (FLEXERIL) 10 MG tablet Take 1 tablet (10 mg total) by mouth 3 (three) times daily as needed for muscle spasms. 60 tablet 1  . meloxicam (MOBIC) 15 MG tablet Take 1 tablet (15 mg total) by mouth daily. 30 tablet 0  . naproxen (NAPROSYN) 500 MG tablet Take 1 tablet (500 mg total) by mouth 2 (two) times daily. (Patient not taking: Reported on 09/05/2017) 30 tablet 0  . nitroGLYCERIN (NITRODUR - DOSED IN MG/24 HR) 0.2 mg/hr patch Apply 1/4th patch to affected shoulder, change daily 30 patch 1  . predniSONE (DELTASONE) 10 MG tablet 6 tabs po day 1, 5 tabs po  day 2, 4 tabs po day 3, 3 tabs po day 4, 2 tabs po day 5, 1 tab po day 6 (Patient not taking: Reported on 09/05/2017) 21 tablet 0   No current facility-administered medications on file prior to visit.     Past Surgical History:  Procedure Laterality Date  . ECTOPIC PREGNANCY SURGERY      No Known Allergies  Social History   Socioeconomic History  . Marital status: Single    Spouse name: Not on file  . Number of children: Not on  file  . Years of education: Not on file  . Highest education level: Not on file  Occupational History  . Not on file  Social Needs  . Financial resource strain: Not on file  . Food insecurity:    Worry: Not on file    Inability: Not on file  . Transportation needs:    Medical: Not on file    Non-medical: Not on file  Tobacco Use  . Smoking status: Current Some Day Smoker  . Smokeless tobacco: Never Used  Substance and Sexual Activity  . Alcohol use: No    Frequency: Never  . Drug use: Never  . Sexual activity: Not on file  Lifestyle  . Physical activity:    Days per week: Not on file    Minutes per session: Not on file  . Stress: Not on file  Relationships  . Social connections:    Talks on phone: Not on file    Gets together: Not on file    Attends religious service: Not on file    Active member of club or organization: Not on file    Attends meetings of clubs or organizations: Not on file    Relationship status: Not on file  . Intimate partner violence:    Fear of current or ex partner: Not on file    Emotionally abused: Not on file    Physically abused: Not on file    Forced sexual activity: Not on file  Other Topics Concern  . Not on file  Social History Narrative  . Not on file    Family History  Problem Relation Age of Onset  . Hypertension Mother     BP 120/70   Ht 5\' 7"  (1.702 m)   Wt 178 lb (80.7 kg)   BMI 27.88 kg/m   Review of Systems: See HPI above.     Objective:  Physical Exam: Vitals:   11/14/17 0919  Weight: 178 lb (80.7 kg)  Height: 5\' 7"  (1.702 m)   Gen: NAD, comfortable in exam room MSK Shoulder: No swelling, ecchymoses.  No gross deformity. TTP over anterior surface of shoulder joint, as well as along mid-clavicular line and over posterior shoulder along subscapularis FROM passively- pain with abduction and flexion past 90 degrees. Strength 5/5 with empty can and resisted internal/external rotation.   Assessment & Plan:  1.  Right Shoulder Pain - patient's ability to care for her shoulder continues to be limited by limited financial resources and lack of insurance; since her last visit, she has had no interval improvement and some interval spread in places that are causing pain, though no evidence of new injury on history or exam - Gave patient GoodRx coupon for $20 nitroglycerin patches; she confirmed her ability to afford - Continued to encourage patient to seek payment plan for MRI - Continue meloxicam with Flexeril - Continue home PT exercises; gave new motion exercises with arm band, though counseled patient that  she is probably still 3-4 weeks away from being able to complete because of pain - Follow up in 6 weeks

## 2018-10-21 IMAGING — DX DG SHOULDER 2+V*R*
3 series · 3 of 3 positions shown · non-contrast
Comparison: None.

CLINICAL DATA: Right shoulder pain with increased stiffness today.
Decreasing range of motion. Posterior scapular pain.

EXAM:
RIGHT SHOULDER - 2+ VIEW

[shoulder ap]
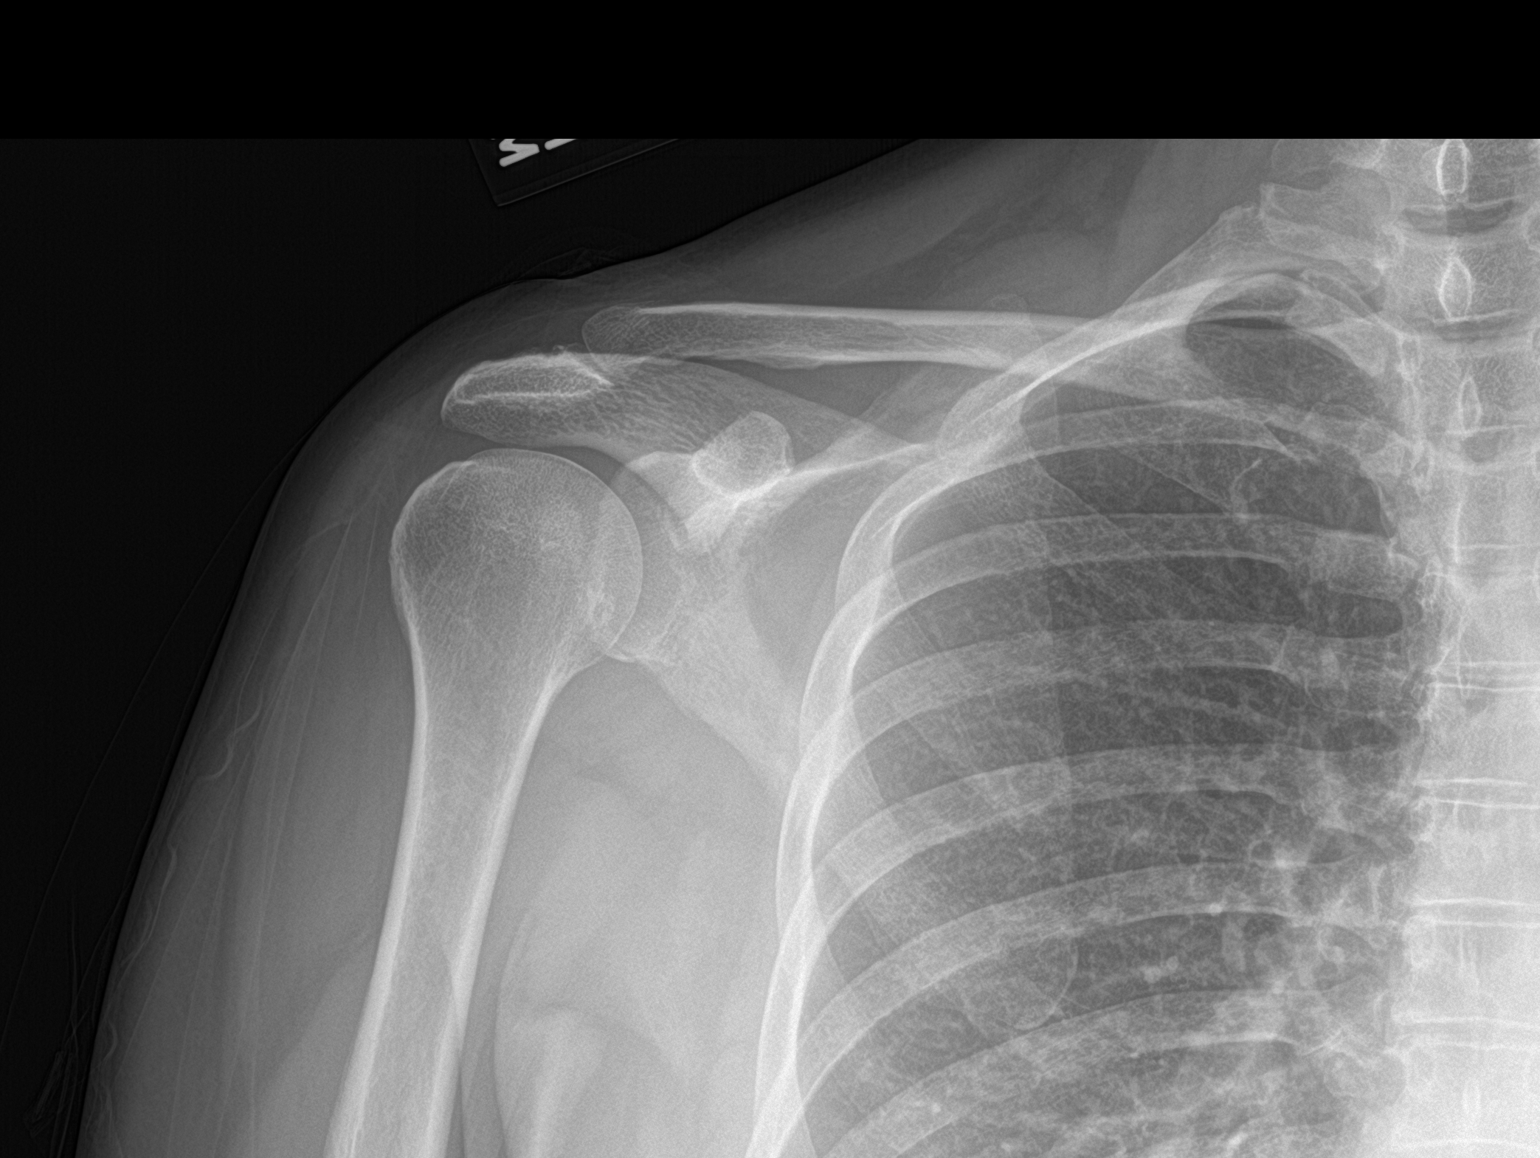

[shoulder grashey]
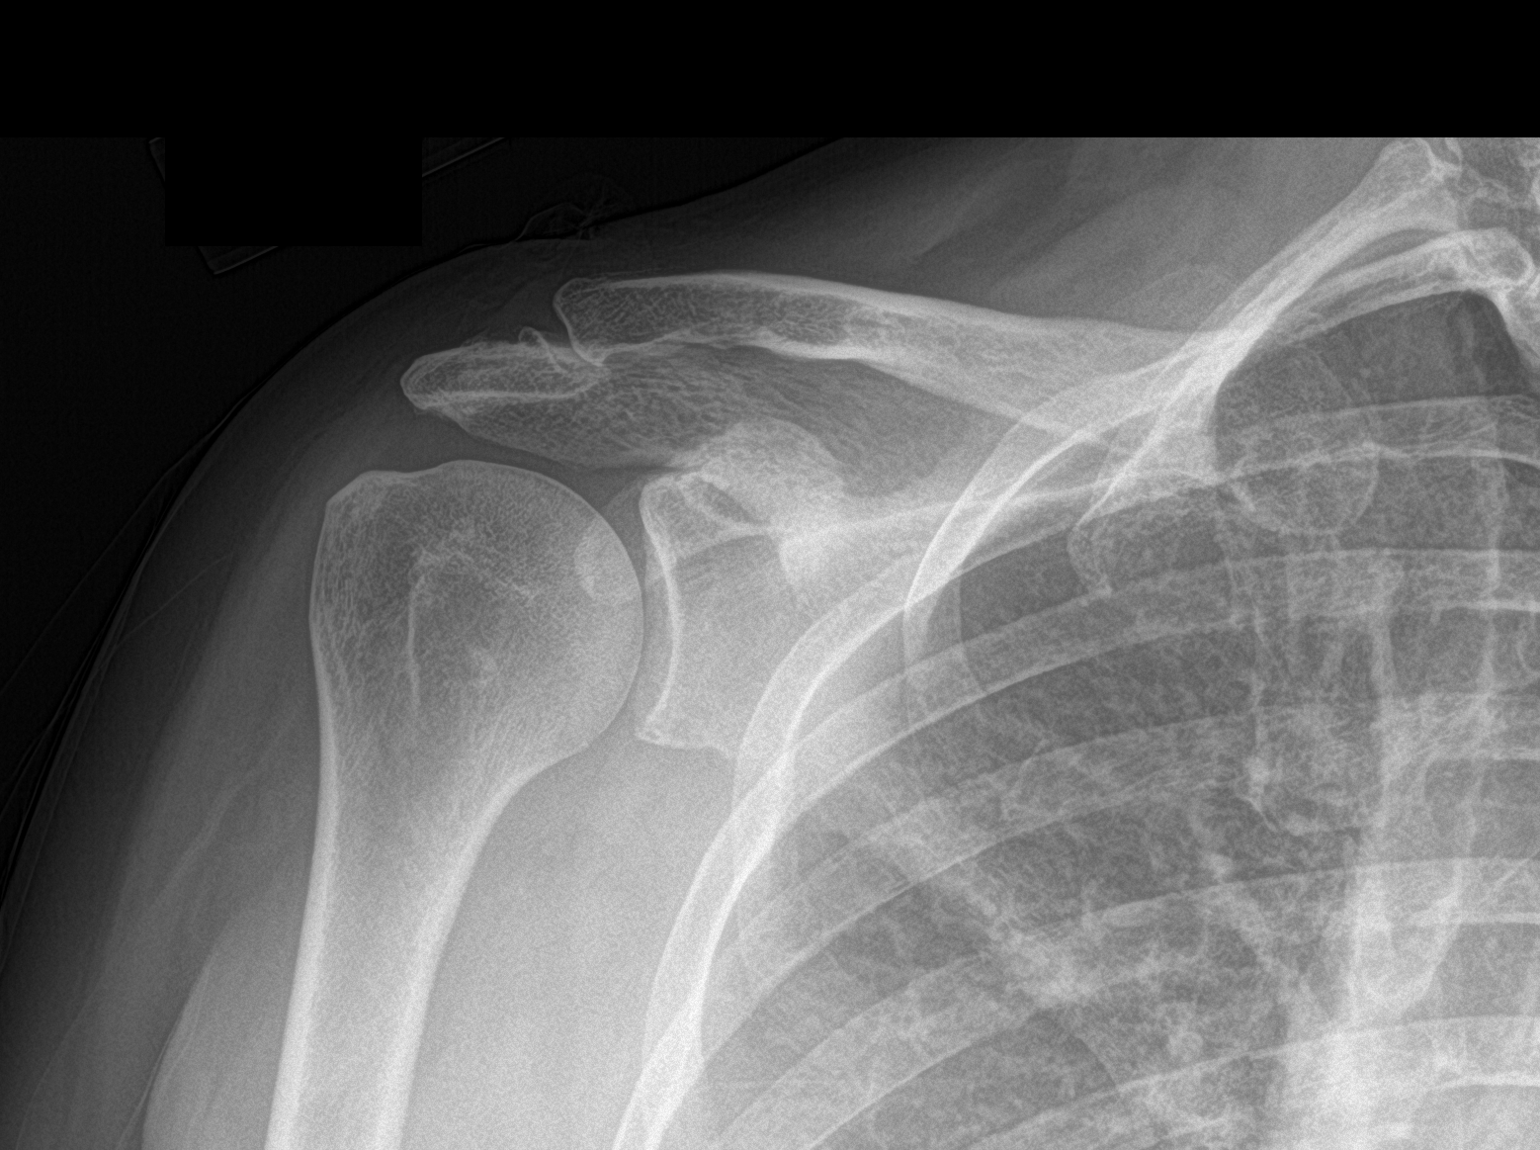

[shoulder y-view]
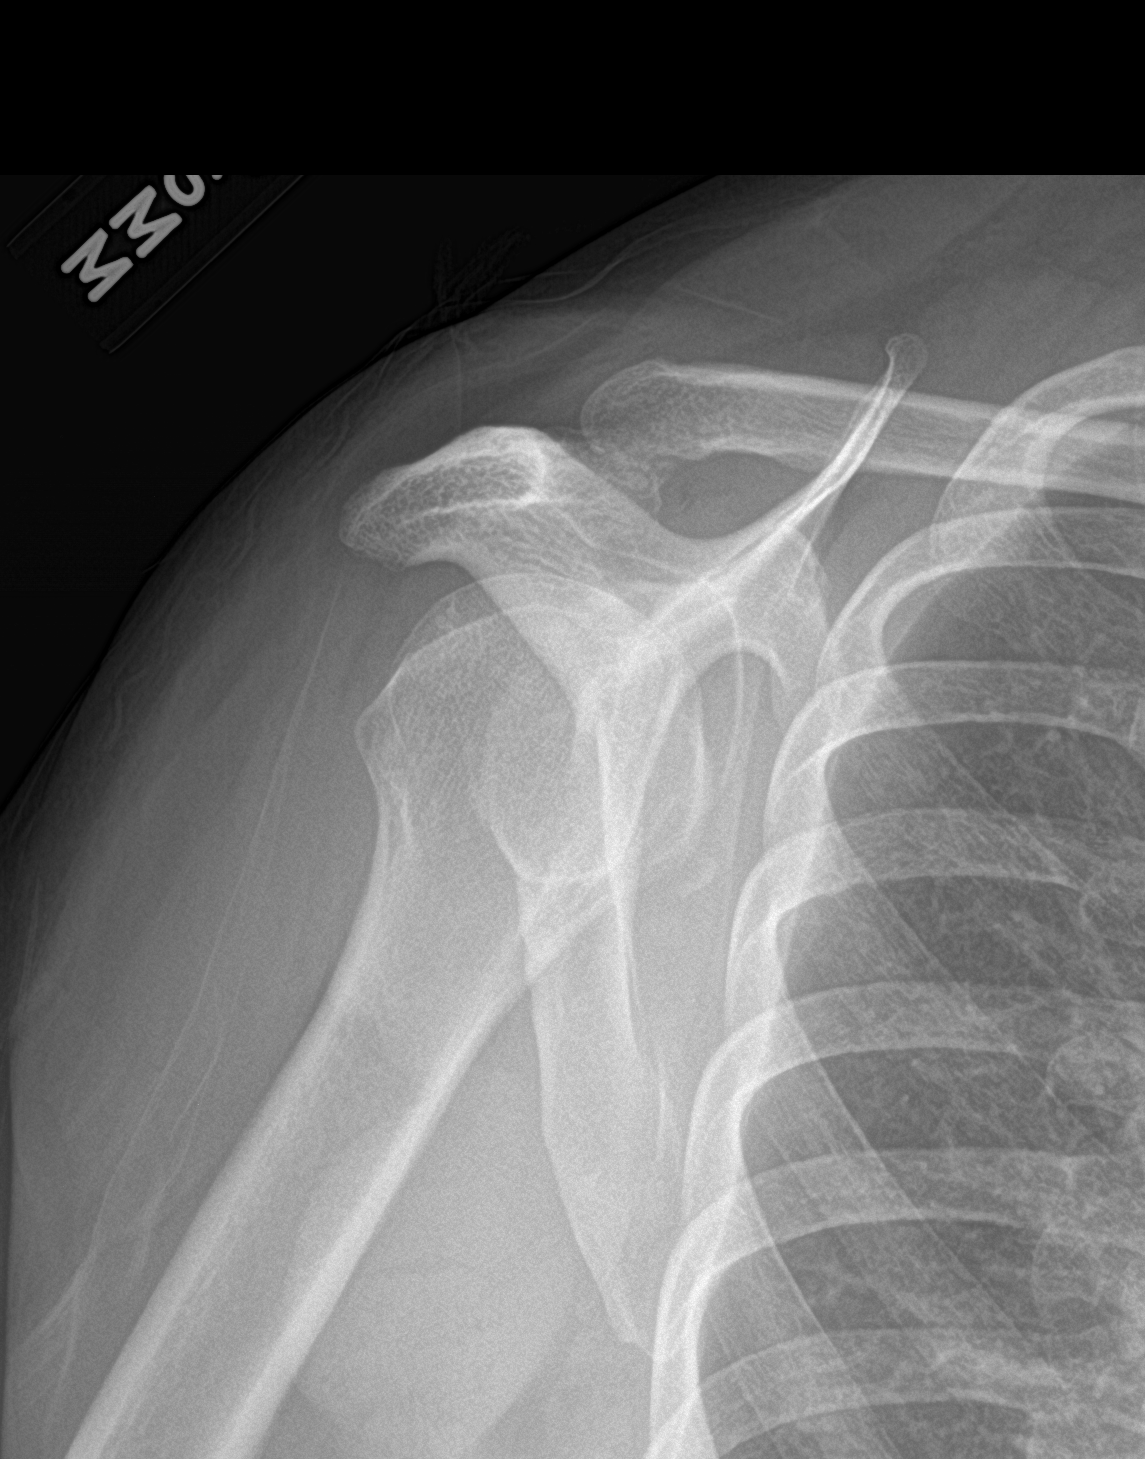

[3 of 3 positions shown; findings below may reference images not displayed]

FINDINGS: There is no evidence of fracture or dislocation. There is no
evidence of arthropathy or other focal bone abnormality. Soft
tissues are unremarkable.
IMPRESSION: Negative right shoulder radiographs.

## 2020-01-27 ENCOUNTER — Encounter: Payer: Self-pay | Admitting: Emergency Medicine

## 2020-01-27 ENCOUNTER — Other Ambulatory Visit: Payer: Self-pay

## 2020-01-27 ENCOUNTER — Ambulatory Visit
Admission: EM | Admit: 2020-01-27 | Discharge: 2020-01-27 | Disposition: A | Discharge status: Home / Self Care | Payer: Self-pay | Attending: Emergency Medicine | Admitting: Emergency Medicine

## 2020-01-27 DIAGNOSIS — N898 Other specified noninflammatory disorders of vagina: Secondary | ICD-10-CM | POA: Insufficient documentation

## 2020-01-27 DIAGNOSIS — R350 Frequency of micturition: Secondary | ICD-10-CM | POA: Insufficient documentation

## 2020-01-27 LAB — POCT URINALYSIS DIP (MANUAL ENTRY)
Bilirubin, UA: NEGATIVE
Blood, UA: NEGATIVE
Glucose, UA: NEGATIVE mg/dL
Ketones, POC UA: NEGATIVE mg/dL
Leukocytes, UA: NEGATIVE
Nitrite, UA: NEGATIVE
Spec Grav, UA: >=1.03 — AB (ref 1.010–1.025)
Urobilinogen, UA: 0.2 E.U./dL
pH, UA: 5.5 (ref 5.0–8.0)

## 2020-01-27 NOTE — Discharge Instructions (Signed)
We are testing you for Gonorrhea, Chlamydia, Trichomonas, Yeast and Bacterial Vaginosis. We will call you if anything is positive and let you know if you require any further treatment. Please inform partners of any positive results.   Please return if symptoms not improving with treatment, development of fever, nausea, vomiting, abdominal pain.  

## 2020-01-27 NOTE — ED Provider Notes (Signed)
[facility]    CSN: [id number] Arrival date & time: 01/27/20  1820      History   Chief Complaint Chief Complaint  Patient presents with  . Exposure to STD    HPI Kristine Ball is a 54 y.o. female presenting today for evaluation of urinary frequency and discharge.  Patient reports that over the past few days she has had some discharge as well as associated urinary frequency.  She reports minimal burning with urination.  She denies any fevers, had one episode of nausea and vomiting last night.  She denies any new sexual partners, has been in the same relationship for the past 5 years.  Denies history of BV and yeast.   HPI  History reviewed. No pertinent past medical history.  Patient Active Problem List   Diagnosis Date Noted  . Right shoulder pain 08/16/2017    Past Surgical History:  Procedure Laterality Date  . ECTOPIC PREGNANCY SURGERY      OB History   No obstetric history on file.      Home Medications    Prior to Admission medications   Medication Sig Start Date End Date Taking? Authorizing Provider  nitroGLYCERIN (NITRODUR - DOSED IN MG/24 HR) 0.2 mg/hr patch Apply 1/4th patch to affected shoulder, change daily 11/14/17   Hudnall, Azucena Fallen, MD    Family History Family History  Problem Relation Age of Onset  . Hypertension Mother     Social History Social History   Tobacco Use  . Smoking status: Current Some Day Smoker  . Smokeless tobacco: Never Used  Substance Use Topics  . Alcohol use: No  . Drug use: Never     Allergies   Patient has no known allergies.   Review of Systems Review of Systems  Constitutional: Negative for fever.  Respiratory: Negative for shortness of breath.   Cardiovascular: Negative for chest pain.  Gastrointestinal: Negative for abdominal pain, diarrhea, nausea and vomiting.  Genitourinary: Positive for frequency and vaginal discharge. Negative for dysuria, flank pain, genital sores, hematuria,  menstrual problem, vaginal bleeding and vaginal pain.  Musculoskeletal: Negative for back pain.  Skin: Negative for rash.  Neurological: Negative for dizziness, light-headedness and headaches.     Physical Exam Triage Vital Signs ED Triage Vitals [01/27/20 1851]  Enc Vitals Group     BP 124/72     Pulse Rate 90     Resp 16     Temp 98.6 F (37 C)     Temp Source Oral     SpO2 96 %     Weight      Height      Head Circumference      Peak Flow      Pain Score      Pain Loc      Pain Edu?      Excl. in GC?    No data found.  Updated Vital Signs BP 124/72 (BP Location: Left Arm)   Pulse 90   Temp 98.6 F (37 C) (Oral)   Resp 16   SpO2 96%   Visual Acuity Right Eye Distance:   Left Eye Distance:   Bilateral Distance:    Right Eye Near:   Left Eye Near:    Bilateral Near:     Physical Exam Vitals and nursing note reviewed.  Constitutional:      Appearance: She is well-developed.     Comments: No acute distress  HENT:     Head: Normocephalic and atraumatic.  Nose: Nose normal.  Eyes:     Conjunctiva/sclera: Conjunctivae normal.  Cardiovascular:     Rate and Rhythm: Normal rate.  Pulmonary:     Effort: Pulmonary effort is normal. No respiratory distress.  Abdominal:     General: There is no distension.  Musculoskeletal:        General: Normal range of motion.     Cervical back: Neck supple.  Skin:    General: Skin is warm and dry.  Neurological:     Mental Status: She is alert and oriented to person, place, and time.      UC Treatments / Results  Labs (all labs ordered are listed, but only abnormal results are displayed) Labs Reviewed  POCT URINALYSIS DIP (MANUAL ENTRY) - Abnormal; Notable for the following components:      Result Value   Spec Grav, UA >=1.030 (*)    Protein Ur, POC trace (*)    All other components within normal limits  URINE CULTURE  CERVICOVAGINAL ANCILLARY ONLY    EKG   Radiology No results  found.  Procedures Procedures (including critical care time)  Medications Ordered in UC Medications - No data to display  Initial Impression / Assessment and Plan / UC Course  I have reviewed the triage vital signs and the nursing notes.  Pertinent labs & imaging results that were available during my care of the patient were reviewed by me and considered in my medical decision making (see chart for details).     UA with negative leuks and nitrites, negative glucose, suspect most likely vaginal causes of urinary frequency.  Vaginal swab pending for further evaluation.  Low suspicion of STD given no new partners.  Most suspicious of bacterial vaginosis, but through discussion with patient opting to defer empiric treatment until results return.  Will call with results and send medicine as needed.  Discussed strict return precautions. Patient verbalized understanding and is agreeable with plan.  Final Clinical Impressions(s) / UC Diagnoses   Final diagnoses:  Vaginal discharge  Urinary frequency     Discharge Instructions     We are testing you for Gonorrhea, Chlamydia, Trichomonas, Yeast and Bacterial Vaginosis. We will call you if anything is positive and let you know if you require any further treatment. Please inform partners of any positive results.   Please return if symptoms not improving with treatment, development of fever, nausea, vomiting, abdominal pain.     ED Prescriptions    None     PDMP not reviewed this encounter.   Lew Dawes, [place] 01/28/20 1458

## 2020-01-27 NOTE — ED Triage Notes (Signed)
Pt here for STD screening c/o vaginal discharge and urinary frequency

## 2020-01-29 ENCOUNTER — Telehealth (HOSPITAL_COMMUNITY): Payer: Self-pay | Admitting: Emergency Medicine

## 2020-01-29 LAB — URINE CULTURE: Culture: <10000 — AB

## 2020-01-29 LAB — CERVICOVAGINAL ANCILLARY ONLY
Bacterial Vaginitis (gardnerella): POSITIVE — AB
Candida Glabrata: NEGATIVE
Candida Vaginitis: NEGATIVE
Chlamydia: NEGATIVE
Comment: NEGATIVE
Comment: NEGATIVE
Comment: NEGATIVE
Comment: NEGATIVE
Comment: NEGATIVE
Comment: NORMAL
Neisseria Gonorrhea: NEGATIVE
Trichomonas: NEGATIVE

## 2020-01-29 MED ORDER — METRONIDAZOLE 500 MG PO TABS: 500.0000 mg | ORAL_TABLET | Freq: Two times a day (BID) | ORAL | 0 refills | Status: DC

## 2020-07-30 ENCOUNTER — Other Ambulatory Visit: Payer: Self-pay

## 2020-07-30 ENCOUNTER — Ambulatory Visit
Admission: EM | Admit: 2020-07-30 | Discharge: 2020-07-30 | Disposition: A | Discharge status: Home / Self Care | Payer: Self-pay | Attending: Emergency Medicine | Admitting: Emergency Medicine

## 2020-07-30 ENCOUNTER — Encounter: Payer: Self-pay | Admitting: Emergency Medicine

## 2020-07-30 DIAGNOSIS — N760 Acute vaginitis: Secondary | ICD-10-CM | POA: Insufficient documentation

## 2020-07-30 DIAGNOSIS — S161XXA Strain of muscle, fascia and tendon at neck level, initial encounter: Secondary | ICD-10-CM | POA: Insufficient documentation

## 2020-07-30 LAB — POCT URINALYSIS DIP (MANUAL ENTRY)
Blood, UA: NEGATIVE
Glucose, UA: NEGATIVE mg/dL
Nitrite, UA: NEGATIVE
Protein Ur, POC: 30 mg/dL — AB
Spec Grav, UA: 1.025 (ref 1.010–1.025)
Urobilinogen, UA: 4 E.U./dL — AB
pH, UA: 7 (ref 5.0–8.0)

## 2020-07-30 MED ORDER — TIZANIDINE HCL 2 MG PO TABS: 2.0000 mg | ORAL_TABLET | Freq: Four times a day (QID) | ORAL | 0 refills | Status: AC | PRN

## 2020-07-30 MED ORDER — METRONIDAZOLE 500 MG PO TABS: 500.0000 mg | ORAL_TABLET | Freq: Two times a day (BID) | ORAL | 0 refills | Status: AC

## 2020-07-30 NOTE — Discharge Instructions (Addendum)
Begin metronidazole/Flagyl twice daily x1 week, no alcohol until 24 hours after last tablet  Continue Tylenol and ibuprofen for neck pain, supplement tizanidine at home/bedtime-this is muscle relaxer, may cause drowsiness  We are testing you for Gonorrhea, Chlamydia, Trichomonas, Yeast and Bacterial Vaginosis. We will call you if anything is positive and let you know if you require any further treatment. Please inform partners of any positive results.   Please return if symptoms not improving with treatment, development of fever, nausea, vomiting, abdominal pain.

## 2020-07-30 NOTE — ED Provider Notes (Signed)
[facility]    CSN: [id number] Arrival date & time: 07/30/20  1657      History   Chief Complaint Chief Complaint  Patient presents with   Vaginal Discharge    HPI Kristine Ball is a 55 y.o. female presenting today for evaluation of vaginal irritation.  Reports over the past 2 weeks has had vaginal discharge, odor and irritation.  Reports associated urinary frequency, but denies dysuria.  Does report history of BV and feels slightly similar.  Denies specific concerns for STDs, denies new partners.  HPI  History reviewed. No pertinent past medical history.  Patient Active Problem List   Diagnosis Date Noted   Right shoulder pain 08/16/2017    Past Surgical History:  Procedure Laterality Date   ECTOPIC PREGNANCY SURGERY      OB History   No obstetric history on file.      Home Medications    Prior to Admission medications   Medication Sig Start Date End Date Taking? Authorizing Provider  metroNIDAZOLE (FLAGYL) 500 MG tablet Take 1 tablet (500 mg total) by mouth 2 (two) times daily. 07/30/20  Yes Adrina Armijo C, PA-C  tiZANidine (ZANAFLEX) 2 MG tablet Take 1-2 tablets (2-4 mg total) by mouth every 6 (six) hours as needed for muscle spasms. 07/30/20  Yes Mycah Formica C, PA-C  nitroGLYCERIN (NITRODUR - DOSED IN MG/24 HR) 0.2 mg/hr patch Apply 1/4th patch to affected shoulder, change daily 11/14/17   Hudnall, Azucena Fallen, MD    Family History Family History  Problem Relation Age of Onset   Hypertension Mother     Social History Social History   Tobacco Use   Smoking status: Some Days    Pack years: 0.00   Smokeless tobacco: Never  Substance Use Topics   Alcohol use: No   Drug use: Never     Allergies   Patient has no known allergies.   Review of Systems Review of Systems  Constitutional:  Negative for fever.  Respiratory:  Negative for shortness of breath.   Cardiovascular:  Negative for chest pain.  Gastrointestinal:  Negative for  abdominal pain, diarrhea, nausea and vomiting.  Genitourinary:  Positive for vaginal discharge. Negative for dysuria, flank pain, genital sores, hematuria, menstrual problem, vaginal bleeding and vaginal pain.  Musculoskeletal:  Negative for back pain.  Skin:  Negative for rash.  Neurological:  Negative for dizziness, light-headedness and headaches.    Physical Exam Triage Vital Signs ED Triage Vitals  Enc Vitals Group     BP      Pulse      Resp      Temp      Temp src      SpO2      Weight      Height      Head Circumference      Peak Flow      Pain Score      Pain Loc      Pain Edu?      Excl. in GC?    No data found.  Updated Vital Signs BP (!) 141/85 (BP Location: Right Arm)   Pulse 87   Temp 98.6 F (37 C) (Oral)   Resp 13   SpO2 97%   Visual Acuity Right Eye Distance:   Left Eye Distance:   Bilateral Distance:    Right Eye Near:   Left Eye Near:    Bilateral Near:     Physical Exam Vitals and nursing note reviewed.  Constitutional:      Appearance: She is well-developed.     Comments: No acute distress  HENT:     Head: Normocephalic and atraumatic.     Nose: Nose normal.  Eyes:     Conjunctiva/sclera: Conjunctivae normal.  Cardiovascular:     Rate and Rhythm: Normal rate.  Pulmonary:     Effort: Pulmonary effort is normal. No respiratory distress.  Abdominal:     General: There is no distension.  Musculoskeletal:        General: Normal range of motion.     Cervical back: Neck supple.     Comments: Left cervical area with tenderness to palpation  Skin:    General: Skin is warm and dry.  Neurological:     Mental Status: She is alert and oriented to person, place, and time.     UC Treatments / Results  Labs (all labs ordered are listed, but only abnormal results are displayed) Labs Reviewed  POCT URINALYSIS DIP (MANUAL ENTRY) - Abnormal; Notable for the following components:      Result Value   Bilirubin, UA small (*)    Ketones, POC  UA trace (5) (*)    Protein Ur, POC =30 (*)    Urobilinogen, UA 4.0 (*)    Leukocytes, UA Trace (*)    All other components within normal limits  URINE CULTURE  CERVICOVAGINAL ANCILLARY ONLY    EKG   Radiology No results found.  Procedures Procedures (including critical care time)  Medications Ordered in UC Medications - No data to display  Initial Impression / Assessment and Plan / UC Course  I have reviewed the triage vital signs and the nursing notes.  Pertinent labs & imaging results that were available during my care of the patient were reviewed by me and considered in my medical decision making (see chart for details).     Vaginitis-most suspicious of BV, empirically treating for BV with Flagyl, vaginal swab pending along with urine culture for more definitive results.  Alter therapy as needed based off results Cervical strain-continue Tylenol/ibuprofen, add in tizanidine to supplement, gentle stretches  Discussed strict return precautions. Patient verbalized understanding and is agreeable with plan.  Final Clinical Impressions(s) / UC Diagnoses   Final diagnoses:  Vaginitis and vulvovaginitis  Cervical strain, acute, initial encounter     Discharge Instructions      Begin metronidazole/Flagyl twice daily x1 week, no alcohol until 24 hours after last tablet  Continue Tylenol and ibuprofen for neck pain, supplement tizanidine at home/bedtime-this is muscle relaxer, may cause drowsiness  We are testing you for Gonorrhea, Chlamydia, Trichomonas, Yeast and Bacterial Vaginosis. We will call you if anything is positive and let you know if you require any further treatment. Please inform partners of any positive results.   Please return if symptoms not improving with treatment, development of fever, nausea, vomiting, abdominal pain.      ED Prescriptions     Medication Sig Dispense Auth. Provider   metroNIDAZOLE (FLAGYL) 500 MG tablet Take 1 tablet (500 mg  total) by mouth 2 (two) times daily. 14 tablet Champ Keetch C, PA-C   tiZANidine (ZANAFLEX) 2 MG tablet Take 1-2 tablets (2-4 mg total) by mouth every 6 (six) hours as needed for muscle spasms. 30 tablet Elizabeth Haff, [place] C, PA-C      PDMP not reviewed this encounter.   Lew Dawes, [place] 07/30/20 1856

## 2020-07-30 NOTE — ED Triage Notes (Signed)
Patient c/o vaginal discharge and irritation x 2 weeks.   Patient endorses odor with "not my normal scent". Patient endorses vaginal irritation and pain during sexual intercourse.   Patient denies any dysuira, ABD pain, and N/V/D.   Patient hasn't taken any medications for symptoms.

## 2020-08-02 LAB — CERVICOVAGINAL ANCILLARY ONLY
Bacterial Vaginitis (gardnerella): POSITIVE — AB
Candida Glabrata: NEGATIVE
Candida Vaginitis: NEGATIVE
Chlamydia: NEGATIVE
Comment: NEGATIVE
Comment: NEGATIVE
Comment: NEGATIVE
Comment: NEGATIVE
Comment: NEGATIVE
Comment: NORMAL
Neisseria Gonorrhea: NEGATIVE
Trichomonas: NEGATIVE

## 2020-08-03 LAB — URINE CULTURE: Culture: 80000 — AB

## 2020-08-05 ENCOUNTER — Telehealth (HOSPITAL_COMMUNITY): Payer: Self-pay | Admitting: Emergency Medicine

## 2020-08-05 MED ORDER — NITROFURANTOIN MONOHYD MACRO 100 MG PO CAPS: 100.0000 mg | ORAL_CAPSULE | Freq: Two times a day (BID) | ORAL | 0 refills | Status: AC

## 2023-12-21 ENCOUNTER — Other Ambulatory Visit: Payer: Self-pay | Admitting: Family Medicine

## 2023-12-21 DIAGNOSIS — Z1231 Encounter for screening mammogram for malignant neoplasm of breast: Secondary | ICD-10-CM
